# Patient Record
Sex: Female | Born: 1985 | Race: White | Hispanic: Yes | Marital: Married | Smoking: Never smoker
Health system: Southern US, Community
[De-identification: ages and names within clinical notes are randomized; demographics above are authoritative.]

---

## 2005-11-26 ENCOUNTER — Emergency Department (HOSPITAL_COMMUNITY): Admission: EM | Admit: 2005-11-26 | Discharge: 2005-11-26 | Payer: Self-pay | Admitting: Emergency Medicine

## 2006-06-09 ENCOUNTER — Inpatient Hospital Stay (HOSPITAL_COMMUNITY): Admission: EM | Admit: 2006-06-09 | Discharge: 2006-06-11 | Payer: Self-pay | Admitting: Obstetrics and Gynecology

## 2006-06-25 ENCOUNTER — Inpatient Hospital Stay (HOSPITAL_COMMUNITY): Admission: EM | Admit: 2006-06-25 | Discharge: 2006-06-28 | Payer: Self-pay | Admitting: Obstetrics and Gynecology

## 2006-06-25 ENCOUNTER — Encounter (INDEPENDENT_AMBULATORY_CARE_PROVIDER_SITE_OTHER): Payer: Self-pay | Admitting: Specialist

## 2007-05-24 ENCOUNTER — Other Ambulatory Visit: Admission: RE | Admit: 2007-05-24 | Discharge: 2007-05-24 | Payer: Self-pay | Admitting: Obstetrics and Gynecology

## 2007-08-15 ENCOUNTER — Emergency Department (HOSPITAL_COMMUNITY): Admission: EM | Admit: 2007-08-15 | Discharge: 2007-08-15 | Payer: Self-pay | Admitting: Emergency Medicine

## 2007-08-25 IMAGING — US US FETAL BPP W/O NONSTRESS
1 series · 14 of 28 positions shown · non-contrast
Comparison: none

CLINICAL DATA: 8-months pregnant, abdominal pain.  Non-English speaking.

[Series 1: unknown · 0.37mm/px · 14 of 29 slices shown]
[im 2/29]
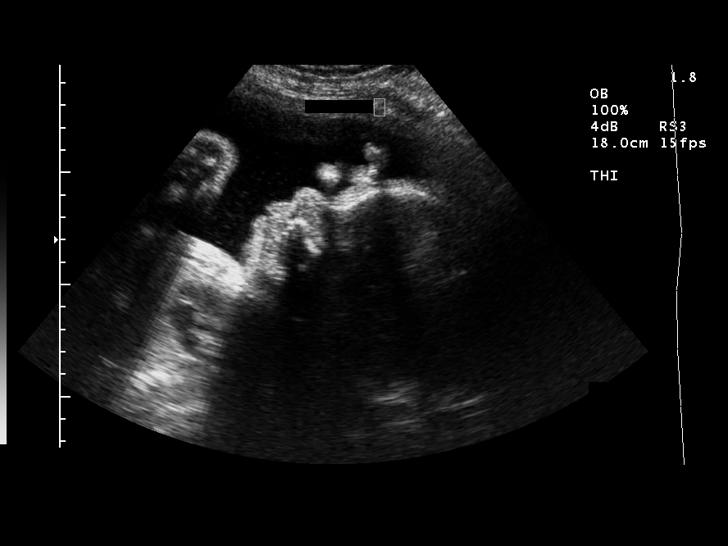
[im 4/29]
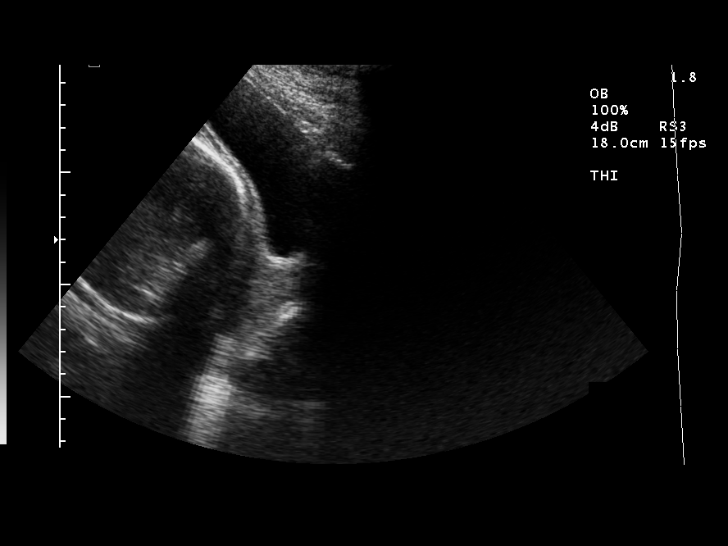
[im 6/29]
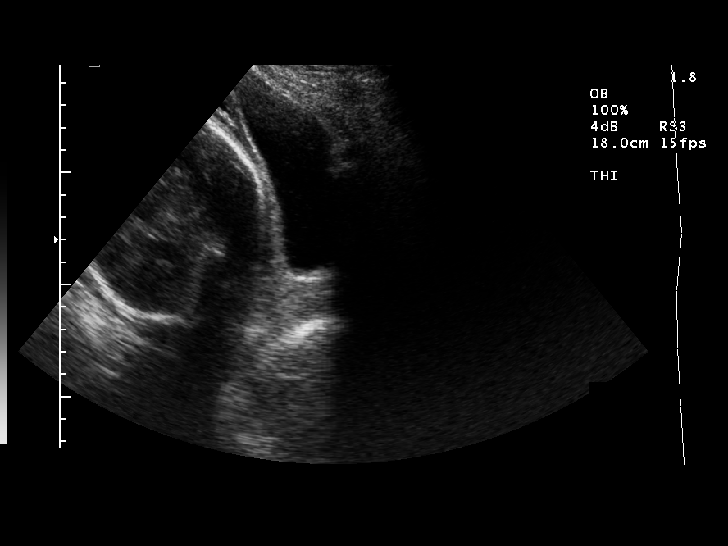
[im 8/29]
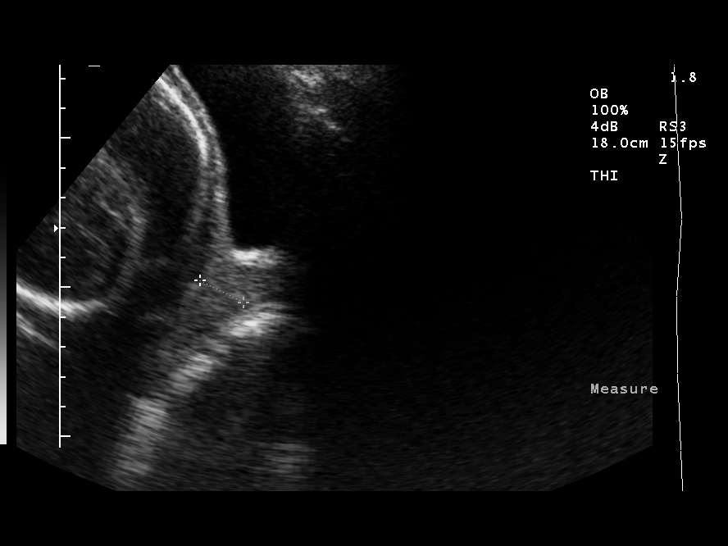
[im 10/29]
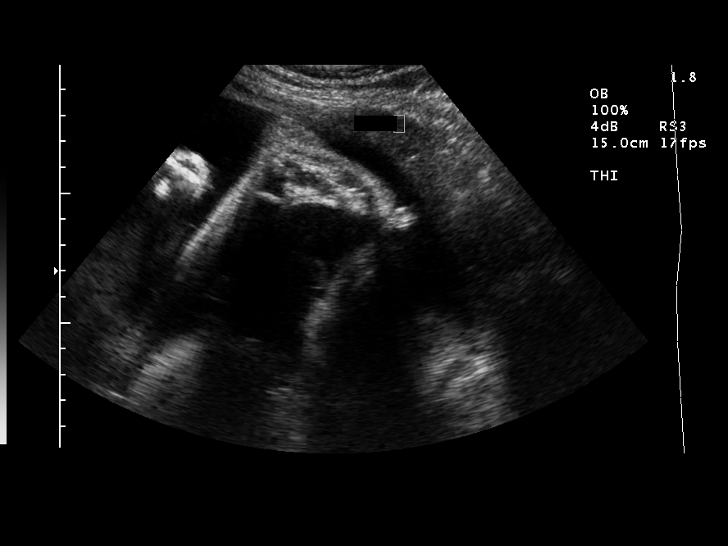
[im 12/29]
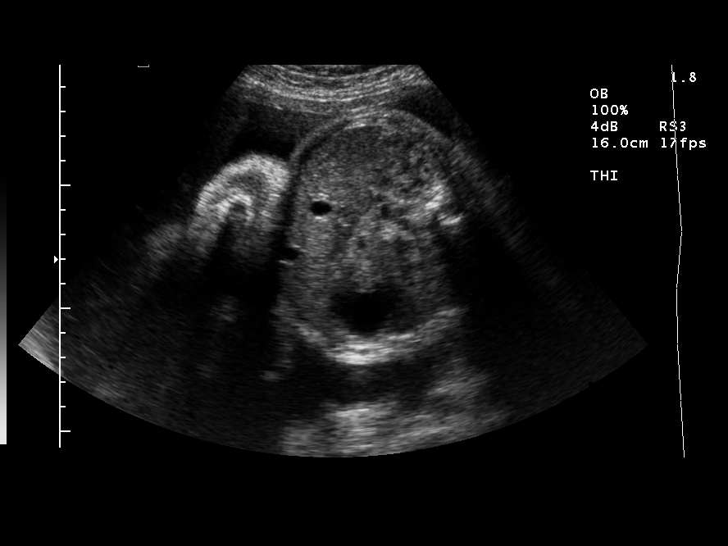
[im 14/29]
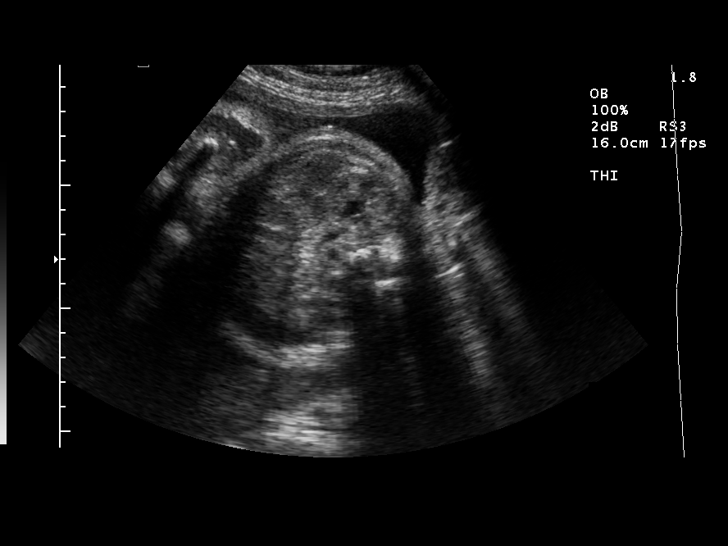
[im 16/29]
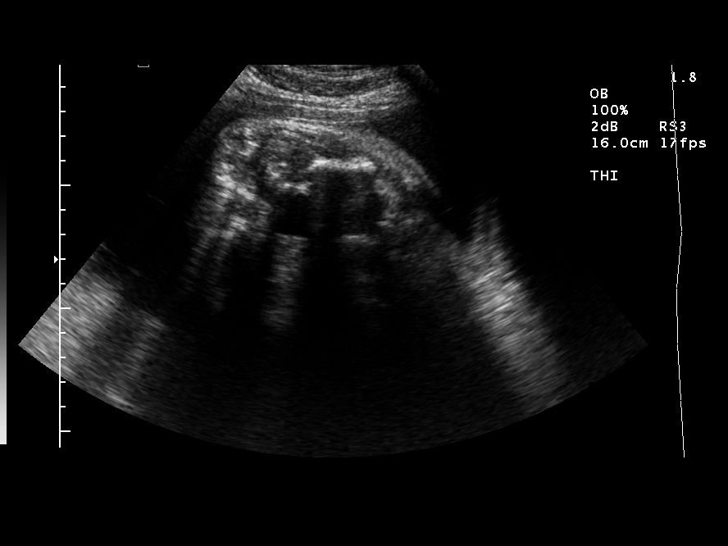
[im 18/29]
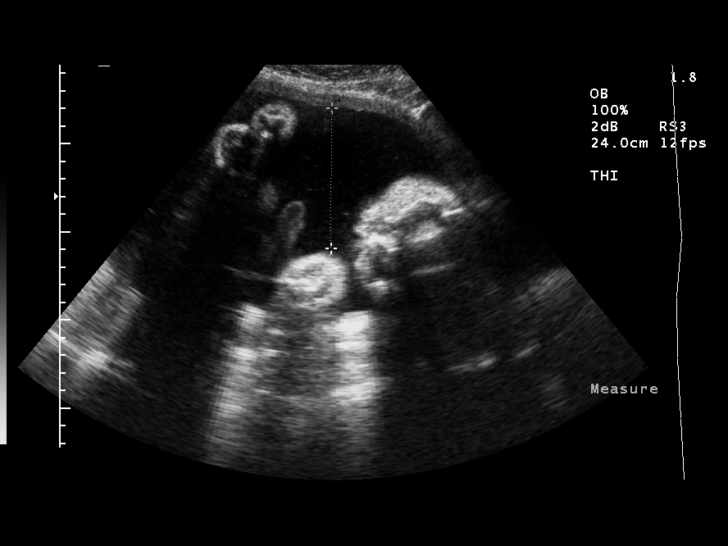
[im 20/29]
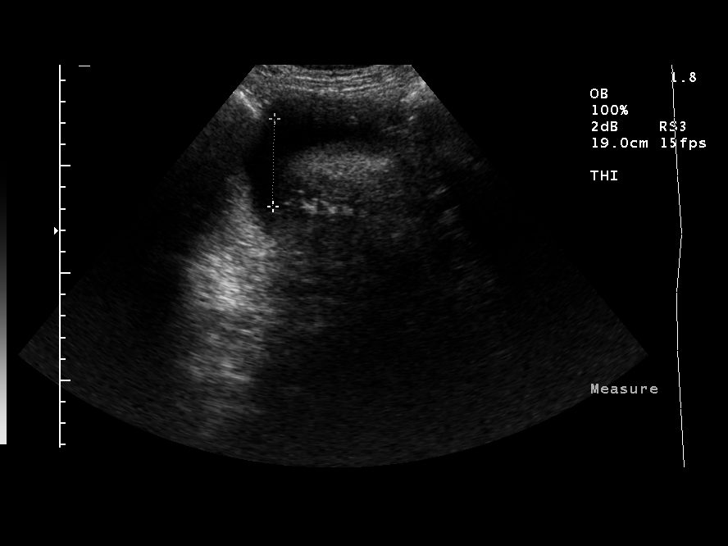
[im 22/29]
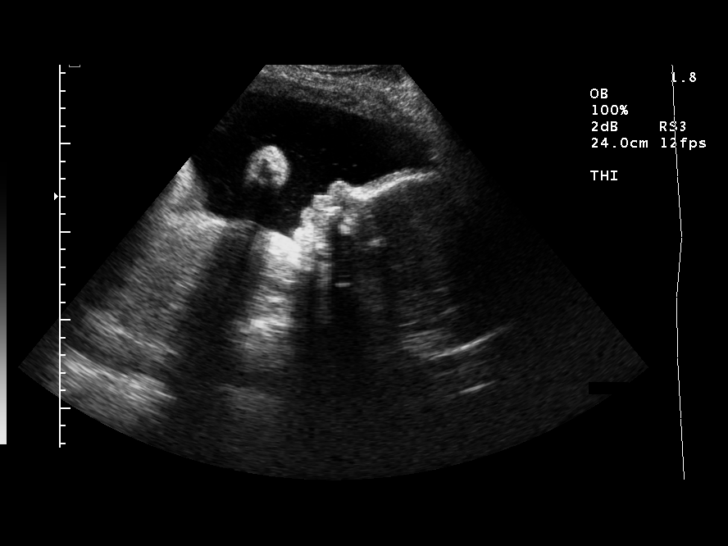
[im 24/29]
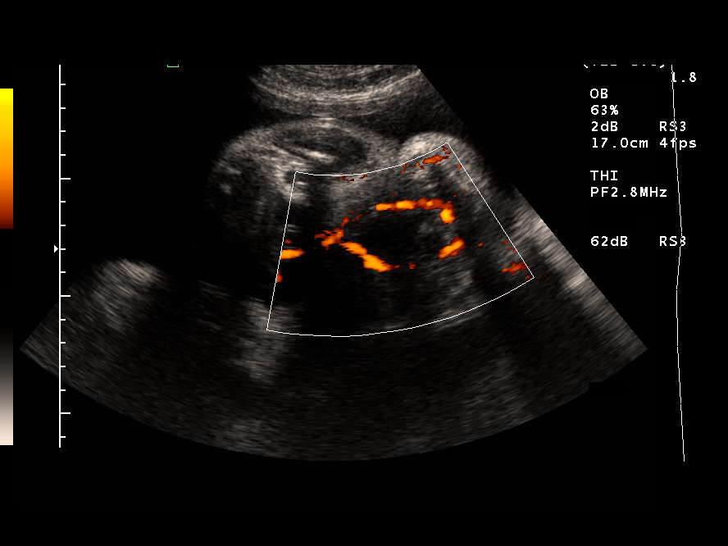
[im 26/29]
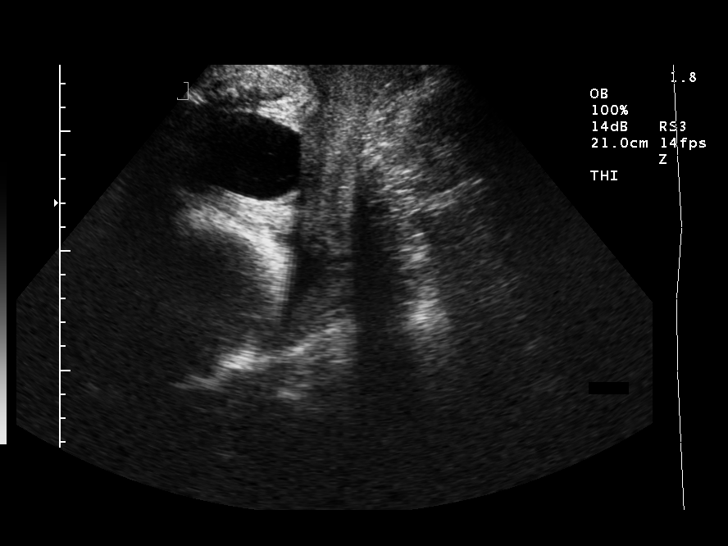
[im 29/29]
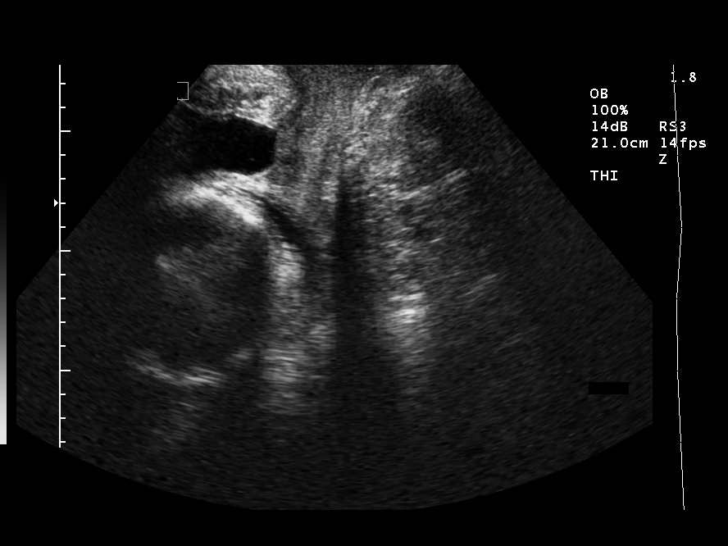

[14 of 28 positions shown; findings below may reference images not displayed]

BIOPHYSICAL PROFILE

 Number of Fetuses:  1
 Heart rate:  142 bpm
 Presentation:  Cephalic
 Placental Location:  Posterior
 Grade:  I
 Previa:  No
 Amniotic Fluid (Subjective):  Increased 
 Amniotic Fluid (Objective):  AFI 27.3 cm (4th-24th %ile = 8.3 to 24.5 cm for 33 weeks) 

 Fetal measurements and complete anatomic evaluation were not requested.  The following fetal anatomy was visualized on this exam:  Lateral ventricles, 4-chamber heart, stomach, 3-vessel cord, cord insertion site, kidneys, and bladder.  

 BPP SCORING
 Movements:  2  Time:  30 minutes
 Breathing:  2
 Tone:  2
 Amniotic Fluid:  2
 Total Score:  8

 MATERNAL UTERINE AND ADNEXAL FINDINGS
 Cervix:  1.6 cm transabdominal.
IMPRESSION: 1.  Single live intrauterine gestation in cephalic presentation.  
 2.  Cervix appears shortened at 1.6 cm length.
 3.  Slightly increased amniotic fluid volume.
 4.  Biophysical profile score [DATE] in 30 minutes. 
 5.  Prominent right fetal renal pelvis, 7 mm diameter, recommend postnatal assessment by ultrasound.

## 2007-09-07 HISTORY — PX: TUBAL LIGATION: SHX77

## 2007-10-27 ENCOUNTER — Encounter: Payer: Self-pay | Admitting: Obstetrics and Gynecology

## 2007-10-27 ENCOUNTER — Inpatient Hospital Stay (HOSPITAL_COMMUNITY): Admission: RE | Admit: 2007-10-27 | Discharge: 2007-10-30 | Payer: Self-pay | Admitting: Obstetrics and Gynecology

## 2011-01-19 NOTE — H&P (Signed)
NAME:  Marie Jordan, Marie Jordan                 ACCOUNT NO.:  [id number]   MEDICAL RECORD NO.:  [id number]          PATIENT TYPE:  INP   LOCATION:  NA                            [facility]   PHYSICIAN:  Tilda Burrow, M.D. DATE OF BIRTH:  April 15, 1986   DATE OF ADMISSION:  10/27/2007  DATE OF DISCHARGE:                              HISTORY & PHYSICAL   HISTORY OF PRESENT ILLNESS:  This 25 year old female, gravida 3, para 2-  0-0-2, now 25 years of age with her third cesarean section scheduled at  this time, desires repeat cesarean section and tubal ligation.  Prenatal  course has been followed through our office with ultrasound assigned  EDC.  Initial LMP suggested LMP of December 25, 2006, placing [facility] October 02, 2007.  Initial prenatal care was at 15 weeks 1 day when ultrasound  on May 10, 2007, indicated an [facility] of October 31, 2007.  Repeat  ultrasound on June 14, 2007, at 20 weeks suggested an [facility] of November 01, 2007.  By these criteria, she is 39+ weeks gestation and is admitted  for repeat C-section and tubal ligation.  The permanency of the  requested procedure has been emphasized to patient and husband.  Patient  understands this and acknowledges permanency requested procedure.   ALLERGIES:  None to medicine or latex.   PAST MEDICAL HISTORY:  Benign.   PAST SURGICAL HISTORY:  C-section x2.  Initial cesarean section was in  [place] with an unknown uterine incision with a previous vertical lower  abdominal incision.  The repeat C-section performed in [facility] June 25, 2006, with a low transverse uterine incision.   PHYSICAL EXAMINATION:  GENERAL APPEARANCE:  A healthy Hispanic female,  alert and oriented x3, communicative to me in Spanish and through  translator of the desire for permanent sterilization which is  specifically discussed in Spanish with her.  HEENT:  Pupils are equal, round and reactive to light.  NECK:  Supple.  CHEST:  Clear to  auscultation.  ABDOMEN:  Fundal height 37 cm on October 17, 2007.   PRENATAL LABORATORY DATA:  Blood type A positive.  Antibody screen  negative.  Sickledex negative.  RPR nonreactive.  Rubella immunity  present.  Hepatitis and HIV normal.  MSAFP, triple screen negative.  Glucose tolerance test 134 mg percent.  Group B Strep probe negative.   PLAN:  Repeat C-section and tubal ligation October 27, 2007.  Correction,Will move surgery to Saturday Oct 28, 2007      Tilda Burrow, M.D.  Electronically Signed    JVF/MEDQ  D:  10/17/2007  T:  10/18/2007  Job:  [id number]

## 2011-01-19 NOTE — Op Note (Signed)
Marie Jordan, Marie Jordan                 ACCOUNT NO.:  [id number]   MEDICAL RECORD NO.:  [id number]          PATIENT TYPE:  INP   LOCATION:  9122                          [facility]ILITY[facility]   PHYSICIAN:  Tilda Burrow, M.D. DATE OF BIRTH:  21-Oct-1985   DATE OF PROCEDURE:  10/27/2007  DATE OF DISCHARGE:                               OPERATIVE REPORT   PREOPERATIVE DIAGNOSES:  1. Pregnancy 39 weeks.  2. Repeat cesarean section after trial of labor.  3. Elective sterilization.   POSTOPERATIVE DIAGNOSES:  1. Pregnancy 39 weeks.  2. Repeat cesarean section after trial of labor.  3. Elective sterilization.  4. Anterior abdominal wall adhesion to uterus.   PROCEDURE:  1. Repeat low transverse cervical cesarean section through a midline      vertical abdominal incision.  2. Bilateral partial salpingectomy.  3. Release of anterior uterine wall adhesions.   SURGEON:  Christin Bach, M.D.   ASSISTANT:  None.   ANESTHESIA:  Spinal.   COMPLICATIONS:  None.   FINDINGS:  A large 9 pound 6 ounce female infant, Apgars 9/9.  Very high  bladder flap on the anterior uterine surface and a 2 cm stalk of  myometrial tissue attached to the anterior abdominal wall beneath the  upper aspects of the vertical abdominal incision.   DETAILS OF PROCEDURE:  The patient was taken to the operating room,  spinal anesthesia introduced without difficulty, abdomen was prepped.  The midline vertical incision was excised, removing the old cicatrix.  The peritoneal cavity was entered without difficulty and bladder found  to be quite high on the anterior abdominal wall but avoided during the  surgery.  Bladder flap was developed adequately to address the lower  uterine segment where a transverse uterine incision was made, clear  amniotic fluid encountered and released.  Fetal vertex guided into the  incision and fundal pressure applied to deliver the baby.  Cord was  clamped and the infant placed under the care of Dr.  Francine Graven, see her  notes for further details on the baby.  Weight was 9 pounds 6 ounces,  Apgars 9/9 assigned.  Cord blood samples were obtained, the placenta  sent to Labor and Delivery.  The uterus was irrigated with saline  solution and IV antibiotics had been given preoperatively.  The uterus  was irrigated, then closed with single layer running locking #0 Chromic.   Bladder flap was reapproximated using some #2-0 Chromic, being careful  to try to reduce the adhesions that had previously made the bladder  quite high on the lower uterine segment.   The fundus of the uterus had a dense firm adhesion from the fundus of  the uterus to the anterior abdominal wall.  This required cross clamping  with two Kelly clamps, ligation with #2-0 Chromic suture and  transection.  Hemostasis was good.   TUBAL LIGATION:  Tubal ligation could then be performed by grasping a  mid segment knuckle of each tube, doubling ligating around it and then  removing the mid segment knuckle of tube for histologic confirmation of  surgical success.  Anterior peritoneum was closed with #2-0 Chromic closure of the  peritoneal cavity, #0 Vicryl closure of the fascial layer using short  segments of running #0 Vicryl with a couple of interrupted sutures in  the midportion of the incision.  The subcu fatty tissues were loosened  laterally so that we could reapproximate skin edges better, and #2-0  plain sutures used in an interrupted fashion to reapproximate subcu fat  and a then staple closure of the skin completed the procedure.  Sponge  and needle counts were correct.  Patient went to the recovery room in  good condition, in good comfort throughout the procedure.      Tilda Burrow, M.D.  Electronically Signed     JVF/MEDQ  D:  10/27/2007  T:  10/27/2007  Job:  [id number]

## 2011-01-19 NOTE — Discharge Summary (Signed)
[name], Marie Jordan                 ACCOUNT NO.:  [id number]   MEDICAL RECORD NO.:  [id number]          PATIENT TYPE:  INP   LOCATION:  9122                          [facility]   PHYSICIAN:  Tilda Burrow, M.D. DATE OF BIRTH:  Sep 13, 1985   DATE OF ADMISSION:  10/27/2007  DATE OF DISCHARGE:  10/30/2007                               DISCHARGE SUMMARY   ADMITTING DIAGNOSES:  1. Pregnancy [redacted] weeks gestation.  2. Prior cesarean section of trial of labor.  3. Elective permanent sterilization.   DISCHARGE DIAGNOSES:  1. Pregnancy [redacted] weeks gestation.  2. Prior cesarean section of trial of labor.  3. Elective permanent sterilization.   PROCEDURE:  Repeat low transverse cervical cesarean section, bilateral  tubal ligation and vertical abdominal incision.   DISCHARGE MEDICATIONS:  1. Tylenol #3, 40 tables.  2. Motrin 600 mg 1 p.o. q.a.c. a 6 hours p.r.n. cramps.  3. Colace 100 mg twice daily x2 weeks.  4. Routine Prenatal vitamins.   FOLLOW-UP:  Four days, staple removal.   HOSPITAL SUMMARY:  This 25 year old Hispanic female underwent repeat  cesarean section, her third section and tubal ligation was performed at  the same time in an uncomplicated fashion with 500 mL estimated blood  loss. The postoperative course was uneventful. Her hemoglobin on  admission was 12.6, hematocrit 37.2, postoperative hemoglobin 10.4,  hematocrit 30.0, blood type is A+. She remained afebrile and was stable  for discharge breast-feeding with bottle supplementation when discharged  with routine postop instructions on  October 30, 2007.      Tilda Burrow, M.D.  Electronically Signed     JVF/MEDQ  D:  10/30/2007  T:  10/30/2007  Job:  [id number]

## 2011-01-22 NOTE — H&P (Signed)
Marie Jordan, Marie Jordan                 ACCOUNT NO.:  [id number]   MEDICAL RECORD NO.:  [id number]          PATIENT TYPE:  OIB   LOCATION:  LDR1                          [facility]   PHYSICIAN:  Tilda Burrow, M.D. DATE OF BIRTH:  05/15/1986   DATE OF ADMISSION:  06/08/2006  DATE OF DISCHARGE:  LH                                HISTORY & PHYSICAL   HISTORY OF PRESENT ILLNESS:  Marie Jordan is a 25 year old, gravida 2, para 1, EDC  July 24, 2006, at approximately [redacted] weeks gestation with pre-term uterine  contractions.   MEDICAL HISTORY:  Negative.   SURGICAL HISTORY:  Positive for previous C-section.   ALLERGIES:  She has no known allergies.   PHYSICAL EXAM:  VITAL SIGNS:  Stable.  Blood type is A positive.  UDS negative.  Rubella is not on the chart.  Hepatitis B surface antigen is negative.  HIV is nonreactive.  _RPR___ is nonreactive.  Pap is normal.  GC and chlamydia is negative.  AFP is normal.  Cervix is a loose 1, thick.  Posterior __position___ .  She is having  regular uterine contractions anywhere from 3-4 minutes apart.  Fetal heart rate pattern is stable with acceleration.   PLAN:  Admit.  CBC.  UA. GC chlamydia.  GBS culture.  Terbutaline 0.25 mg  sub.q per protocol and Tocolytics and IV antibiotics.      Marie Jordan, Marie Jordan      Tilda Burrow, M.D.  Electronically Signed    DL/MEDQ  D:  16/06/9603  T:  06/08/2006  Job:  [id number]   cc:   Tilda Burrow, M.D.  Fax: [phone]

## 2011-01-22 NOTE — Discharge Summary (Signed)
Marie Jordan, Marie Jordan                 ACCOUNT NO.:  [id number]   MEDICAL RECORD NO.:  [id number]          PATIENT TYPE:  INP   LOCATION:  LDR1                          [facility]   PHYSICIAN:  Lazaro Arms, M.D.   DATE OF BIRTH:  Apr 02, 1986   DATE OF ADMISSION:  06/08/2006  DATE OF DISCHARGE:  10/06/2007LH                                 DISCHARGE SUMMARY   DISCHARGE DIAGNOSES:  1. Status post repeat cesarean section.  2. Unremarkable postoperative course.   Please refer to the history and physical details admission to hospital in  the antepartum chart.   Patient had previous cesarean section.  She is supposed to undergone a  cesarean section at 39 weeks but came in at 36-1/2 weeks with spontaneous  labor 6 cm.  As a result we proceeded with cesarean section which was  unremarkable.  I did do an excision of cicatrix.  Postoperatively she did  well, tolerated clear liquids and regular diet.  She voided without  symptoms, ambulatory.  Her incision was clean, dry and intact.  She  tolerated oral pain medicine.  Hemoglobin and hematocrit were stable.  She  was discharged to home on morning of postoperative day #3 in good and stable  condition to follow up in the office next week to have her staples removed.      Lazaro Arms, M.D.  Electronically Signed     LHE/MEDQ  D:  07/06/2006  T:  07/06/2006  Job:  [id number]

## 2011-01-22 NOTE — Discharge Summary (Signed)
NAMEADELAIDE, Marie Jordan                 ACCOUNT NO.:  [id number]   MEDICAL RECORD NO.:  [id number]          PATIENT TYPE:  INP   LOCATION:  A401                          [facility]   PHYSICIAN:  Lazaro Arms, M.D.   DATE OF BIRTH:  [birth year]   DATE OF ADMISSION:  06/25/2006  DATE OF DISCHARGE:  10/23/2007LH                                 DISCHARGE SUMMARY   DISCHARGE DIAGNOSES:  1. Status post repeat cesarean section with revision of scar.  2. Unremarkable postoperative course.   PROCEDURE:  Repeat cesarean section.   HISTORY OF PRESENT ILLNESS:  Please refer to the history and physical in  chart for details of admission to hospital.   HOSPITAL COURSE:  The patient was admitted at 35-6/7 weeks' gestation.  She  came in spontaneous labor with a cervix of 6 cm.  She had delivered by C-  section previously in [place] and she had a vertical skin incision.  As a  result, she was not for a trial of labor.  We did a cesarean section under  spinal without difficulty.  Please see the operative note for details.  Marytza had an unremarkable postop course.  She tolerated clear liquids and a  regular diet.  She voided without symptoms and was ambulatory.  Her postop  day #1, hemoglobin was 10.3 and 30.  Postop day #3, hemoglobin and  hematocrit was 10 and 30 with a white count of 7700.  She remained afebrile.  She was extensively ambulatory.  Her incision was clean, dry and intact.  I  did do a vertical skin and remove the cicatrix from previous incision and it  is healing nicely.  We will see her in the office next week to have the  staples removed.  She is given Tylox and Motrin for pain.      Lazaro Arms, M.D.  Electronically Signed     LHE/MEDQ  D:  06/28/2006  T:  06/28/2006  Job:  [id number]

## 2011-01-22 NOTE — H&P (Signed)
Marie Jordan, Marie Jordan                 ACCOUNT NO.:  [id number]   MEDICAL RECORD NO.:  [id number]          PATIENT TYPE:  INP   LOCATION:  LDR1                          [facility]   PHYSICIAN:  Tilda Burrow, M.D. DATE OF BIRTH:  05/23/1986   DATE OF ADMISSION:  06/08/2006  DATE OF DISCHARGE:  10/06/2007LH                                HISTORY & PHYSICAL   ADMISSION DIAGNOSIS:  Pregnancy [redacted] weeks gestation, preterm uterine  irritability.   DISCHARGE DIAGNOSES:  Pregnancy 34 weeks, not delivered.  Preterm uterine irritability, resolved.   OPERATION/PROCEDURE:  1. Terbutaline protocol overnight.  2. Megace and sulfate therapy.  3. Tocolysis for 24 hours.  4. Betamethasone 12 mg IM q.24h. on October 3 and June 09, 2006.   DISCHARGE MEDICATIONS:  Brethine 5 mg p.o. q.6h.   HOSPITAL COURSE:  This Hispanic female, gravida 2, para 1, [redacted] weeks  gestation, was admitted after presenting on June 08, 2006 complaining of  preterm uterine contractions.  She was initiated on terbutaline tocolysis  and began on IV antibiotics.  Cervix was loose, 1 cm thick, posterior vertex  presentation upon initial presentation.   She was placed on terbutaline tocolysis on October 3.  This was  approximately 7 p.m.  She received ampicillin as prophylaxis.  During the  late night hours, she broke through the terbutaline with contractions and  was placed on Magnesium sulfate therapy with 4 g loading dose, 2 g per hour.  The Brethine was discontinued.  The following day Magnesium level was  obtained which was normal.  She received betamethasone therapy.  The  following day the contractions had stopped.  Cervix had shown some changes.  She had an ultrasound on June 09, 2006 which showed normal biophysical  profile with biophysical profile of 8 out of 8 with NST reactive, making 10  out of 10.  The cervix measured 1.6 cm in length transabdominally with no  visible dilation on transabdominal  ultrasound.  Hemoglobin on admission was  12, hematocrit 35 with white count going up slightly on June 10, 2006.  It  should be noted that the patient had received betamethasone by that time.  Laboratory evaluation included a negative urinalysis, group B strep culture  which is not reported in the chart, GC and Chlamydia culture which were  negative.   The patient was discontinued from tocolytics on June 10, 2006 and  discharged per Dr. Despina Hidden on June 11, 2006 for followup in the office.      Tilda Burrow, M.D.  Electronically Signed     JVF/MEDQ  D:  06/30/2006  T:  07/02/2006  Job:  [id number]

## 2011-01-22 NOTE — Op Note (Signed)
Marie Jordan, Marie Jordan                 ACCOUNT NO.:  [id number]   MEDICAL RECORD NO.:  [id number]          PATIENT TYPE:  INP   LOCATION:  A401                          [facility]ACILITY[facility]   PHYSICIAN:  Lazaro Arms, M.D.   DATE OF BIRTH:  1986-03-02   DATE OF PROCEDURE:  06/25/2006  DATE OF DISCHARGE:                                 OPERATIVE REPORT   PREOPERATIVE DIAGNOSES:  1. Intrauterine pregnancy at 35-6/[redacted] weeks gestation.  2. Previous cesarean section in [place].  3. Labor.  4. Cervix dilated 6 cm.   POSTOPERATIVE DIAGNOSES:  1. Intrauterine pregnancy at 35-6/[redacted] weeks gestation.  2. Previous cesarean section in [place].  3. Labor.  4. Cervix dilated 6 cm.   PROCEDURE:  Repeat cesarean section.   SURGEON:  Lazaro Arms, M.D.   ANESTHESIA:  Spinal.   FINDINGS:  The patient had a large amount of adhesive disease  intraperitoneal.  She had a previous vertical skin incision.  I cannot tell  if she had a vertical uterine incision but that is my suspicion based on the  kind of adhesions she had.  The uterus, tubes and ovaries were otherwise  normal.  The infant was delivered at 1331, was a viable female.  Apgars were 9  and 9.  Being weighed in the nursery.  There was three-vessel cord.  Cord  blood and cord gases were sent.  Dr. Milford Cage was in attendance for routine  neonatal resuscitation.   DESCRIPTION OF OPERATION:  patient was taken to the operating room and  placed in the sitting position where she underwent spinal anesthetic.  She  was then placed in the supine position with a roll under her right hip.  She  was prepped and draped in the usual sterile fashion.  A Foley catheter had  been placed.  She had a previous vertical skin incision and this midline  incision was made and carried down sharply through the rectus fascia which  was scored in the midline and extended laterally.  The muscles were divided.  The peritoneal cavity was entered but she had dense adhesions of the  anterior abdominal wall and peritoneum to the uterus and also the bladder  was adherent high on the lower uterine segment.  This was taken down with  great care to avoid bladder injury.  A low transverse hysterotomy incision  was made and over this incision was delivered a viable female infant at 35  with Apgars of 9 and 9 with weight to be determined in then nursery.  Cord  blood and cord gas were sent.  There was three-vessel cord.  The placenta  was normal.  The infant underwent routine neonatal resuscitation provided by  Dr. Milford Cage.  The uterus was wiped clean.  It was closed in two layers, the  first being a running interlocking layer and the second being an imbricating  layer.  There was good hemostasis.  The peritoneal cavity was irrigated and  all pedicles found to be hemostatic.  The subcutaneous tissue, fascia,  muscle and peritoneum were closed in a modified Smead-Jones  fashion using 0  PDS far-far, near-near.  The subcutaneous tissue was irrigated.  The  previous wide scar was then removed sharply and hemostasis was achieved  using electrocautery unit.  The subcutaneous tissue was irrigated.  Skin was  closed using skin staples.  There was such little subcutaneous fat that a JP  drain was not practical to be placed.  The patient tolerated the procedure  well.  She experienced 750 mL of blood loss.  Was taken to the recovery room  in good and stable condition.  All counts were correct.  She received Ancef  prophylactically.      Lazaro Arms, M.D.  Electronically Signed     LHE/MEDQ  D:  06/25/2006  T:  06/26/2006  Job:  [id number]

## 2011-05-28 LAB — CBC
HCT: 30 — ABNORMAL LOW
HCT: 37.2
Hemoglobin: 12.6
MCHC: 34
MCHC: 34.8
MCV: 87.3
Platelets: 196
RBC: 3.44 — ABNORMAL LOW
RDW: 13.1
WBC: 7

## 2011-05-28 LAB — TYPE AND SCREEN: Antibody Screen: NEGATIVE

## 2011-06-14 LAB — DIFFERENTIAL
Basophils Absolute: 0
Basophils Relative: 0
Eosinophils Absolute: 0 — ABNORMAL LOW
Eosinophils Relative: 0
Lymphs Abs: 1.2
Neutrophils Relative %: 82 — ABNORMAL HIGH

## 2011-06-14 LAB — ABO/RH: ABO/RH(D): A POS

## 2011-06-14 LAB — CBC
HCT: 35 — ABNORMAL LOW
MCHC: 34.1
MCV: 86.3
Platelets: 252
RDW: 13
WBC: 8.6

## 2015-06-23 ENCOUNTER — Encounter: Payer: Self-pay | Admitting: Physician Assistant

## 2015-06-23 ENCOUNTER — Ambulatory Visit: Payer: Self-pay | Admitting: Physician Assistant

## 2015-06-23 VITALS — BP 110/78 | HR 85 | Temp 97.9°F | Ht <= 58 in | Wt 181.9 lb

## 2015-06-23 DIAGNOSIS — R300 Dysuria: Secondary | ICD-10-CM

## 2015-06-23 DIAGNOSIS — N939 Abnormal uterine and vaginal bleeding, unspecified: Secondary | ICD-10-CM

## 2015-06-23 DIAGNOSIS — N941 Unspecified dyspareunia: Secondary | ICD-10-CM

## 2015-06-23 LAB — POCT URINALYSIS DIPSTICK
Bilirubin, UA: NEGATIVE
Blood, UA: NEGATIVE
Glucose, UA: NEGATIVE
Ketones, UA: NEGATIVE
Leukocytes, UA: NEGATIVE
Nitrite, UA: NEGATIVE
Protein, UA: NEGATIVE
Spec Grav, UA: 1.015
Urobilinogen, UA: 0.2
pH, UA: 7.5

## 2015-06-23 NOTE — Progress Notes (Signed)
   BP 110/78 mmHg  Pulse 85  Temp(Src) 97.9 F (36.6 C)  Ht 4\' 10"  (1.473 m)  Wt 181 lb 14.4 oz (82.509 kg)  BMI 38.03 kg/m2  SpO2 98%   Subjective:    Patient ID: Marie Jordan, female    DOB: 05/15/1986, 29 y.o.   MRN: [id number]  HPI: Marie Jordan is a 29 y.o. female presenting on 06/23/2015 for Vaginal Bleeding   HPI   Chief Complaint  Patient presents with  . Vaginal Bleeding    2 weeks ago pt noticed bleeding during intercourse. pt states she feels pain during and after intercourse. pt states this occurs when she is not on her menstrual cycle. pt states she does not have abd pain only vaginal pain.      LMP 05/24/15.  Pt states she is irregular Pt states pain with intercourse which started about 1 1/2 wk ago.  The bleeding came prior to the pain.   States just a little bit of bleeding. Pt also states burning at times.  Only burns during intercourse but not always.  Pt has sex only with man and is monogamous and denies using foreign objects in vagina.    Pt record sttaes last pap 10/2012 at [facility] but we do not have a copy of that. Pt states it was normal     Relevant past medical, surgical, family and social history reviewed and updated as indicated. Interim medical history since our last visit reviewed. Allergies and medications reviewed and updated.  No current outpatient prescriptions on file.   Review of Systems  Constitutional: Negative for fever.  Respiratory: Negative for shortness of breath.   Gastrointestinal: Negative for abdominal pain.  Genitourinary: Positive for vaginal bleeding, vaginal pain, pelvic pain and dyspareunia. Negative for dysuria, urgency, frequency, hematuria, decreased urine volume, vaginal discharge, difficulty urinating, genital sores and menstrual problem.            Objective:    BP 110/78 mmHg  Pulse 85  Temp(Src) 97.9 F (36.6 C)  Ht 4\' 10"  (1.473 m)  Wt 181 lb 14.4 oz (82.509 kg)  BMI 38.03 kg/m2  SpO2 98%  Wt  Readings from Last 3 Encounters:  06/23/15 181 lb 14.4 oz (82.509 kg)    Physical Exam  Constitutional: She is oriented to person, place, and time. She appears well-developed and well-nourished.  Abdominal: Soft. Bowel sounds are normal. She exhibits distension. She exhibits no mass. There is no tenderness. There is no rebound and no guarding.  Genitourinary: Vagina normal. Pelvic exam was performed with patient supine. No erythema, tenderness or bleeding in the vagina. No foreign body around the vagina. No signs of injury around the vagina. No vaginal discharge found.  Cervix not observed and difficult to palpate.  PAP not done. No CMT or mass.  (nurse Berenice assisted)  Neurological: She is alert and oriented to person, place, and time.  Skin: Skin is warm and dry.  Psychiatric: She has a normal mood and affect. Her behavior is normal.  Vitals reviewed.       Assessment & Plan:   Encounter Diagnoses  Name Primary?  . Dysuria Yes  . Dyspareunia in female   . Vagina bleeding     Pt is given cone discount application.  Will refer to gyn for further evaluation of dyspareunia and bleeding

## 2016-05-11 ENCOUNTER — Encounter: Payer: Self-pay | Admitting: Physician Assistant

## 2016-05-11 ENCOUNTER — Ambulatory Visit: Payer: Self-pay | Admitting: Physician Assistant

## 2016-05-11 VITALS — BP 122/84 | HR 89 | Temp 97.9°F | Ht <= 58 in | Wt 180.5 lb

## 2016-05-11 DIAGNOSIS — E785 Hyperlipidemia, unspecified: Secondary | ICD-10-CM

## 2016-05-11 DIAGNOSIS — E669 Obesity, unspecified: Secondary | ICD-10-CM | POA: Insufficient documentation

## 2016-05-11 NOTE — Progress Notes (Signed)
   BP 122/84 (BP Location: Left Arm, Patient Position: Sitting, Cuff Size: Normal)   Pulse 89   Temp 97.9 F (36.6 C)   Ht 4\' 10"  (1.473 m)   Wt 180 lb 8 oz (81.9 kg)   SpO2 98%   BMI 37.72 kg/m    Subjective:    Patient ID: Marie Jordan, female    DOB: 10/06/1985, 30 y.o.   MRN: [id number]  HPI: Marie Jordan is a 30 y.o. female presenting on 05/11/2016 for Hyperlipidemia   HPI Pt says she is doing well.  She is no longer having painful intercourse.   Relevant past medical, surgical, family and social history reviewed and updated as indicated. Interim medical history since our last visit reviewed. Allergies and medications reviewed and updated.  No current outpatient prescriptions on file.  Review of Systems  Constitutional: Negative for appetite change, chills, diaphoresis, fatigue, fever and unexpected weight change.  HENT: Negative for congestion, dental problem, drooling, ear pain, facial swelling, hearing loss, mouth sores, sneezing, sore throat, trouble swallowing and voice change.   Eyes: Negative for pain, discharge, redness, itching and visual disturbance.  Respiratory: Negative for cough, choking, shortness of breath and wheezing.   Cardiovascular: Negative for chest pain, palpitations and leg swelling.  Gastrointestinal: Negative for abdominal pain, blood in stool, constipation, diarrhea and vomiting.  Endocrine: Negative for cold intolerance, heat intolerance and polydipsia.  Genitourinary: Negative for decreased urine volume, dysuria and hematuria.  Musculoskeletal: Negative for arthralgias, back pain and gait problem.  Skin: Negative for rash.  Allergic/Immunologic: Positive for environmental allergies.  Neurological: Negative for seizures, syncope, light-headedness and headaches.  Hematological: Negative for adenopathy.  Psychiatric/Behavioral: Negative for agitation, dysphoric mood and suicidal ideas. The patient is not nervous/anxious.     Per HPI unless  specifically indicated above     Objective:    BP 122/84 (BP Location: Left Arm, Patient Position: Sitting, Cuff Size: Normal)   Pulse 89   Temp 97.9 F (36.6 C)   Ht 4\' 10"  (1.473 m)   Wt 180 lb 8 oz (81.9 kg)   SpO2 98%   BMI 37.72 kg/m   Wt Readings from Last 3 Encounters:  05/11/16 180 lb 8 oz (81.9 kg)  06/23/15 181 lb 14.4 oz (82.5 kg)    Physical Exam  Constitutional: She is oriented to person, place, and time. She appears well-developed and well-nourished.  HENT:  Head: Normocephalic and atraumatic.  Neck: Neck supple.  Cardiovascular: Normal rate and regular rhythm.   Pulmonary/Chest: Effort normal and breath sounds normal.  Abdominal: Soft. Bowel sounds are normal. She exhibits no mass. There is no hepatosplenomegaly. There is no tenderness.  Musculoskeletal: She exhibits no edema.  Lymphadenopathy:    She has no cervical adenopathy.  Neurological: She is alert and oriented to person, place, and time.  Skin: Skin is warm and dry.  Psychiatric: She has a normal mood and affect. Her behavior is normal.  Vitals reviewed.       Assessment & Plan:   Encounter Diagnoses  Name Primary?  . Hyperlipidemia Yes  . Obesity, unspecified     -Check fasting lipids this Saturday. Will call with results next week -[facility]. RTO sooner prn

## 2016-06-04 ENCOUNTER — Other Ambulatory Visit: Payer: Self-pay | Admitting: Physician Assistant

## 2016-06-04 LAB — LIPID PANEL
Cholesterol: 141 mg/dL (ref 125–200)
HDL: 33 mg/dL — AB (ref >=46)
LDL CALC: 88 mg/dL (ref <130)
TRIGLYCERIDES: 101 mg/dL (ref <150)
Total CHOL/HDL Ratio: 4.3 Ratio (ref <=5.0)
VLDL: 20 mg/dL (ref <30)

## 2016-06-08 ENCOUNTER — Ambulatory Visit: Payer: Self-pay | Admitting: Physician Assistant

## 2016-06-08 ENCOUNTER — Encounter: Payer: Self-pay | Admitting: Physician Assistant

## 2016-06-08 VITALS — BP 122/80 | HR 87 | Temp 97.5°F | Ht <= 58 in | Wt 175.0 lb

## 2016-06-08 DIAGNOSIS — Z124 Encounter for screening for malignant neoplasm of cervix: Secondary | ICD-10-CM

## 2016-06-08 NOTE — Progress Notes (Signed)
BP 122/80 (BP Location: Left Arm, Patient Position: Sitting, Cuff Size: Normal)   Pulse 87   Temp 97.5 F (36.4 C) (Other (Comment))   Ht 4\' 10"  (1.473 m)   Wt 175 lb (79.4 kg)   LMP 05/31/2016 (Exact Date)   SpO2 99%   BMI 36.58 kg/m    Subjective:    Patient ID: Marie Jordan, female    DOB: [birth year], 30 y.o.   MRN: [id number]  HPI: Marie Jordan is a 30 y.o. female presenting on 06/08/2016 for Gynecologic Exam   HPI      Relevant past medical, surgical, family and social history reviewed and updated as indicated. Interim medical history since our last visit reviewed. Allergies and medications reviewed and updated.  No current outpatient prescriptions on file.   Review of Systems  Constitutional: Negative for appetite change, chills, diaphoresis, fatigue, fever and unexpected weight change.  HENT: Negative for congestion, dental problem, drooling, ear pain, facial swelling, hearing loss, mouth sores, sneezing, sore throat, trouble swallowing and voice change.   Eyes: Negative for pain, discharge, redness, itching and visual disturbance.  Respiratory: Negative for cough, choking, shortness of breath and wheezing.   Cardiovascular: Negative for chest pain, palpitations and leg swelling.  Gastrointestinal: Negative for abdominal pain, blood in stool, constipation, diarrhea and vomiting.  Endocrine: Negative for cold intolerance, heat intolerance and polydipsia.  Genitourinary: Negative for decreased urine volume, dysuria and hematuria.  Musculoskeletal: Negative for arthralgias, back pain and gait problem.  Skin: Negative for rash.  Allergic/Immunologic: Positive for environmental allergies.  Neurological: Negative for seizures, syncope, light-headedness and headaches.  Hematological: Negative for adenopathy.  Psychiatric/Behavioral: Negative for agitation, dysphoric mood and suicidal ideas. The patient is not nervous/anxious.     Per HPI unless specifically indicated  above     Objective:    BP 122/80 (BP Location: Left Arm, Patient Position: Sitting, Cuff Size: Normal)   Pulse 87   Temp 97.5 F (36.4 C) (Other (Comment))   Ht 4\' 10"  (1.473 m)   Wt 175 lb (79.4 kg)   LMP 05/31/2016 (Exact Date)   SpO2 99%   BMI 36.58 kg/m   Wt Readings from Last 3 Encounters:  06/08/16 175 lb (79.4 kg)  05/11/16 180 lb 8 oz (81.9 kg)  06/23/15 181 lb 14.4 oz (82.5 kg)    Physical Exam  Constitutional: She is oriented to person, place, and time. She appears well-developed and well-nourished.  Pulmonary/Chest: Effort normal.  Abdominal: Soft. She exhibits no mass. There is no tenderness. There is no rebound and no guarding.  Genitourinary: Vagina normal and uterus normal. No breast swelling, tenderness, discharge or bleeding. There is no rash, tenderness or lesion on the right labia. There is no rash, tenderness or lesion on the left labia. Cervix exhibits no motion tenderness, no discharge and no friability. Right adnexum displays no mass, no tenderness and no fullness. Left adnexum displays no mass, no tenderness and no fullness.  Genitourinary Comments: (nurse Berenice assisted)  Neurological: She is alert and oriented to person, place, and time.  Skin: Skin is warm and dry.  Psychiatric: She has a normal mood and affect. Her behavior is normal.  Nursing note and vitals reviewed.   Results for orders placed or performed in visit on 06/04/16  Lipid panel  Result Value Ref Range   Cholesterol 141 125 - 200 mg/dL   Triglycerides 132 <440 mg/dL   HDL 33 (L) >=10 mg/dL   Total CHOL/HDL Ratio 4.3 <=5.0  Ratio   VLDL 20 <30 mg/dL   LDL Cholesterol 88 <409<130 mg/dL      Assessment & Plan:   Encounter Diagnosis  Name Primary?  . Encounter for Papanicolaou smear for cervical cancer screening Yes     -reviewed labs with pt -f/u September 2018 as scheduled.  RTO sooner prn

## 2016-07-01 ENCOUNTER — Ambulatory Visit: Payer: Self-pay | Admitting: Physician Assistant

## 2016-07-08 ENCOUNTER — Ambulatory Visit: Payer: Self-pay | Admitting: Physician Assistant

## 2016-07-27 ENCOUNTER — Ambulatory Visit: Payer: Self-pay | Admitting: Physician Assistant

## 2016-07-27 ENCOUNTER — Encounter: Payer: Self-pay | Admitting: Physician Assistant

## 2016-07-27 VITALS — BP 128/82 | HR 71 | Temp 97.9°F | Ht <= 58 in | Wt 171.8 lb

## 2016-07-27 DIAGNOSIS — E669 Obesity, unspecified: Secondary | ICD-10-CM

## 2016-07-27 DIAGNOSIS — Z6835 Body mass index (BMI) 35.0-35.9, adult: Principal | ICD-10-CM

## 2016-07-27 NOTE — Progress Notes (Signed)
   BP 128/82 (BP Location: Left Arm, Patient Position: Sitting, Cuff Size: Normal)   Pulse 71   Temp 97.9 F (36.6 C)   Ht 4\' 10"  (1.473 m)   Wt 171 lb 12 oz (77.9 kg)   SpO2 98%   BMI 35.90 kg/m    Subjective:    Patient ID: Marie Jordan, female    DOB: 10/24/1985, 30 y.o.   MRN: [id number]  HPI: Marie CoderMaria Y Efferson is a 30 y.o. female presenting on 07/27/2016 for Gynecologic Exam   HPI   Pt here for PAP today but she now Has insurance  Relevant past medical, surgical, family and social history reviewed and updated as indicated. Interim medical history since our last visit reviewed. Allergies and medications reviewed and updated.  No current outpatient prescriptions on file.   Review of Systems  Constitutional: Negative for appetite change, chills, diaphoresis, fatigue, fever and unexpected weight change.  HENT: Negative for congestion, dental problem, drooling, ear pain, facial swelling, hearing loss, mouth sores, sneezing, sore throat, trouble swallowing and voice change.   Eyes: Negative for pain, discharge, redness, itching and visual disturbance.  Respiratory: Negative for cough, choking, shortness of breath and wheezing.   Cardiovascular: Negative for chest pain, palpitations and leg swelling.  Gastrointestinal: Negative for abdominal pain, blood in stool, constipation, diarrhea and vomiting.  Endocrine: Negative for cold intolerance, heat intolerance and polydipsia.  Genitourinary: Negative for decreased urine volume, dysuria and hematuria.  Musculoskeletal: Negative for arthralgias, back pain and gait problem.  Skin: Negative for rash.  Allergic/Immunologic: Positive for environmental allergies.  Neurological: Negative for seizures, syncope, light-headedness and headaches.  Hematological: Negative for adenopathy.  Psychiatric/Behavioral: Negative for agitation, dysphoric mood and suicidal ideas. The patient is not nervous/anxious.     Per HPI unless specifically  indicated above     Objective:    BP 128/82 (BP Location: Left Arm, Patient Position: Sitting, Cuff Size: Normal)   Pulse 71   Temp 97.9 F (36.6 C)   Ht 4\' 10"  (1.473 m)   Wt 171 lb 12 oz (77.9 kg)   SpO2 98%   BMI 35.90 kg/m   Wt Readings from Last 3 Encounters:  07/27/16 171 lb 12 oz (77.9 kg)  06/08/16 175 lb (79.4 kg)  05/11/16 180 lb 8 oz (81.9 kg)    Physical Exam  Constitutional: She is oriented to person, place, and time. She appears well-developed and well-nourished.  Pulmonary/Chest: Effort normal.  Neurological: She is alert and oriented to person, place, and time.  Skin: Skin is warm and dry.  Psychiatric: She has a normal mood and affect. Her behavior is normal.  Nursing note and vitals reviewed.       Assessment & Plan:   Encounter Diagnosis  Name Primary?  . Class 2 obesity with body mass index (BMI) of 35.0 to 35.9 in adult, unspecified obesity type, unspecified whether serious comorbidity present Yes     Discussed with pt no longer eligible to be pt here since she has insurance. She is given recommendations for local PCP

## 2017-05-11 ENCOUNTER — Ambulatory Visit: Payer: Self-pay | Admitting: Physician Assistant

## 2020-06-19 ENCOUNTER — Other Ambulatory Visit: Payer: Self-pay

## 2020-06-19 DIAGNOSIS — Z20822 Contact with and (suspected) exposure to covid-19: Secondary | ICD-10-CM

## 2020-06-20 LAB — NOVEL CORONAVIRUS, NAA: SARS-CoV-2, NAA: NOT DETECTED

## 2020-06-20 LAB — SARS-COV-2, NAA 2 DAY TAT

## 2023-06-29 ENCOUNTER — Telehealth: Payer: Self-pay

## 2023-06-29 NOTE — Telephone Encounter (Signed)
Attempted wellness /follow up call to [facility] client from [facility]/enrollment expired 05/21/23.Has not been seen at [facility] since 2023.  No answer today. Unable to leave [employer]preter services used.   Francee Nodal RN [employer]
